# Patient Record
Sex: Male | Born: 1995 | Race: White | Hispanic: No | Marital: Single | Smoking: Never smoker
Health system: Southern US, Community
[De-identification: ages and names within clinical notes are randomized; demographics above are authoritative.]

## PROBLEM LIST (undated history)

## (undated) DIAGNOSIS — G43909 Migraine, unspecified, not intractable, without status migrainosus: Secondary | ICD-10-CM

---
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
## 2015-01-16 ENCOUNTER — Ambulatory Visit
Admission: EM | Admit: 2015-01-16 | Discharge: 2015-01-16 | Disposition: A | Discharge status: Home / Self Care | Payer: BLUE CROSS/BLUE SHIELD | Attending: Family Medicine | Admitting: Family Medicine

## 2015-01-16 ENCOUNTER — Encounter: Payer: Self-pay | Admitting: Emergency Medicine

## 2015-01-16 DIAGNOSIS — H6692 Otitis media, unspecified, left ear: Secondary | ICD-10-CM | POA: Diagnosis not present

## 2015-01-16 DIAGNOSIS — J069 Acute upper respiratory infection, unspecified: Secondary | ICD-10-CM

## 2015-01-16 MED ORDER — AMOXICILLIN 875 MG PO TABS: 875.0000 mg | ORAL_TABLET | Freq: Two times a day (BID) | ORAL | Status: AC

## 2015-01-16 NOTE — ED Provider Notes (Signed)
[facility]  ____________________________________________  Time seen: Approximately 1948 PM  I have reviewed the triage vital signs and the nursing notes.   HISTORY  Chief Complaint Otalgia   HPI Jonathan Hammond is a [age] y.o. male presents for complaints of left ear pain x 2 days. States pain is 4/10 aching pain. Denies aggravating factors. Denies pain radiation. Denies fall or trauma. Denies head injury. Denies foreign bodies. Denies hearing changes. Denies ear discharge or drainage.   Reports over last 1-1.5 weeks has had some runny nose, nasal congestion and intermittent cough and reports these symptoms are improving. Denies fevers, dizziness, headache, neck pain, back pain, chest pain, shortness of breath, abdominal pain, trauma, rash or other complaints. States not taking over the counter medications for same.    Past Medical History  Diagnosis Date  . Migraines     There are no active problems to display for this patient.   History reviewed. No pertinent past surgical history.  Current Outpatient Rx  Name  Route  Sig  Dispense  Refill  . nortriptyline (PAMELOR) 10 MG capsule   Oral   Take 10 mg by mouth at bedtime.         .           .           .             Allergies Review of patient's allergies indicates no known allergies.  History reviewed. No pertinent family history.  Social History Social History  Substance Use Topics  . Smoking status: Never Smoker   . Smokeless tobacco: Never Used  . Alcohol Use: No    Review of Systems Constitutional: No fever/chills Eyes: No visual changes. ENT: No sore throat. Left ear pain. Positive runny nose, congestion and intermittent cough.  Cardiovascular: Denies chest pain. Respiratory: Denies shortness of breath. Gastrointestinal: No abdominal pain.  No nausea, no vomiting.  No diarrhea.  No constipation. Genitourinary: Negative for dysuria. Musculoskeletal: Negative for back pain. Skin: Negative  for rash. Neurological: Negative for headaches, focal weakness or numbness.  10-point ROS otherwise negative.   ____________________________________________   PHYSICAL EXAM:  VITAL SIGNS: ED Triage Vitals  Enc Vitals Group     BP 01/16/15 1859 130/76 mmHg     Pulse Rate 01/16/15 1859 82     Resp 01/16/15 1859 16     Temp 01/16/15 1859 97 F (36.1 C)     Temp Source 01/16/15 1859 Tympanic     SpO2 01/16/15 1859 98 %     Weight 01/16/15 1859 157 lb (71.215 kg)     Height 01/16/15 1859 5\' 10"  (1.778 m)     Head Cir --      Peak Flow --      Pain Score 01/16/15 1902 3     Pain Loc --      Pain Edu? --      Excl. in GC? --     Constitutional: Alert and oriented. Well appearing and in no acute distress. Eyes: Conjunctivae are normal. PERRL. EOMI. Head: Atraumatic. No sinus TTP, no swelling, no erythema. No TMJ tenderness.   Ears: Right: no erythema, normal TMs, no exudate or drainage, nontender, TM intact. Left: mod erythema, with mild bulging TM, no exudate or drainage, TM  appears intact, nontender to palpation. No surrounding erythema, swelling, or tenderness. Hearing grossly intact bilaterally.   Nose: mild nasal congestion with clear rhinorrhea.   Mouth/Throat: Mucous membranes are  moist.  Oropharynx non-erythematous.No tonsillar swelling or exudate.  Neck: No stridor.  No cervical spine tenderness to palpation. Hematological/Lymphatic/Immunilogical: No cervical lymphadenopathy. Cardiovascular: Normal rate, regular rhythm. Grossly normal heart sounds.  Good peripheral circulation. Respiratory: Normal respiratory effort.  No retractions. Lungs CTAB. Gastrointestinal: Soft and nontender. No distention. Normal Bowel sounds.   Musculoskeletal: No lower or upper extremity tenderness nor edema.  No cervical, thoracic or lumbar TTP.  Neurologic:  Normal speech and language. No gross focal neurologic deficits are appreciated. No gait instability. Skin:  Skin is warm, dry and intact.  No rash noted. Psychiatric: Mood and affect are normal. Speech and behavior are normal.  ____________________________________________   LABS (all labs ordered are listed, but only abnormal results are displayed)  Labs Reviewed - No data to display ____________________________________________   INITIAL IMPRESSION / ASSESSMENT AND PLAN / ED COURSE  Pertinent labs & imaging results that were available during my care of the patient were reviewed by me and considered in my medical decision making (see chart for details).  Very well appearing. No acute distress. Mother at bedside. Presents for complaints of left ear pain x 2 days. Reports recent upper respiratory infection symptoms x 1-1.5 weeks prior to onset of ear pain. Lungs clear throughout. Left otitis media. Will treat otitis media with oral amoxicllin and supportive treatments for upper respiratory infection which suspect viral, with rest, fluids, prn OTC claritin-D (reports will get over the counter and not need rx), prn otc ibuprofen or tylenol and PCP follow up. Information for Dr Genevive Bi  ENT also given as needed.    Discussed follow up with Primary care physician this week. Discussed follow up and return parameters including no resolution or any worsening concerns. Patient verbalized understanding and agreed to plan.   ____________________________________________   FINAL CLINICAL IMPRESSION(S) / ED DIAGNOSES  Final diagnoses:  Acute left otitis media, recurrence not specified, unspecified otitis media type  Upper respiratory infection       Renford Dills, NP 01/18/15 1435

## 2015-01-16 NOTE — ED Notes (Signed)
Patient c/o pain in his left ear since Saturday.  Patient denies fevers.

## 2015-01-16 NOTE — Discharge Instructions (Signed)
Take medication as prescribed. Take over the counter tylenol or ibuprofen as needed. Rest. Take over the counter Claritin-D as discussed.   Follow up with your primary care physician or Ear, nose and throat above as needed. Return to Urgent care for new or worsening concerns.   Otitis Media, Adult Otitis media is redness, soreness, and inflammation of the middle ear. Otitis media may be caused by allergies or, most commonly, by infection. Often it occurs as a complication of the common cold. SIGNS AND SYMPTOMS Symptoms of otitis media may include:  Earache.  Fever.  Ringing in your ear.  Headache.  Leakage of fluid from the ear. DIAGNOSIS To diagnose otitis media, your health care provider will examine your ear with an otoscope. This is an instrument that allows your health care provider to see into your ear in order to examine your eardrum. Your health care provider also will ask you questions about your symptoms. TREATMENT  Typically, otitis media resolves on its own within 3-5 days. Your health care provider may prescribe medicine to ease your symptoms of pain. If otitis media does not resolve within 5 days or is recurrent, your health care provider may prescribe antibiotic medicines if he or she suspects that a bacterial infection is the cause. HOME CARE INSTRUCTIONS   If you were prescribed an antibiotic medicine, finish it all even if you start to feel better.  Take medicines only as directed by your health care provider.  Keep all follow-up visits as directed by your health care provider. SEEK MEDICAL CARE IF:  You have otitis media only in one ear, or bleeding from your nose, or both.  You notice a lump on your neck.  You are not getting better in 3-5 days.  You feel worse instead of better. SEEK IMMEDIATE MEDICAL CARE IF:   You have pain that is not controlled with medicine.  You have swelling, redness, or pain around your ear or stiffness in your neck.  You  notice that part of your face is paralyzed.  You notice that the bone behind your ear (mastoid) is tender when you touch it. MAKE SURE YOU:   Understand these instructions.  Will watch your condition.  Will get help right away if you are not doing well or get worse.   This information is not intended to replace advice given to you by your health care provider. Make sure you discuss any questions you have with your health care provider.   Document Released: 10/27/2003 Document Revised: 02/11/2014 Document Reviewed: 08/18/2012 Elsevier Interactive Patient Education 2016 [employer]  Upper Respiratory Infection, Adult Most upper respiratory infections (URIs) are a viral infection of the air passages leading to the lungs. A URI affects the nose, throat, and upper air passages. The most common type of URI is nasopharyngitis and is typically referred to as "the common cold." URIs run their course and usually go away on their own. Most of the time, a URI does not require medical attention, but sometimes a bacterial infection in the upper airways can follow a viral infection. This is called a secondary infection. Sinus and middle ear infections are common types of secondary upper respiratory infections. Bacterial pneumonia can also complicate a URI. A URI can worsen asthma and chronic obstructive pulmonary disease (COPD). Sometimes, these complications can require emergency medical care and may be life threatening.  CAUSES Almost all URIs are caused by viruses. A virus is a type of germ and can spread from one person to  another.  RISKS FACTORS You may be at risk for a URI if:   You smoke.   You have chronic heart or lung disease.  You have a weakened defense (immune) system.   You are very young or very old.   You have nasal allergies or asthma.  You work in crowded or poorly ventilated areas.  You work in health care facilities or schools. SIGNS AND SYMPTOMS  Symptoms typically  develop 2-3 days after you come in contact with a cold virus. Most viral URIs last 7-10 days. However, viral URIs from the influenza virus (flu virus) can last 14-18 days and are typically more severe. Symptoms may include:   Runny or stuffy (congested) nose.   Sneezing.   Cough.   Sore throat.   Headache.   Fatigue.   Fever.   Loss of appetite.   Pain in your forehead, behind your eyes, and over your cheekbones (sinus pain).  Muscle aches.  DIAGNOSIS  Your health care provider may diagnose a URI by:  Physical exam.  Tests to check that your symptoms are not due to another condition such as:  Strep throat.  Sinusitis.  Pneumonia.  Asthma. TREATMENT  A URI goes away on its own with time. It cannot be cured with medicines, but medicines may be prescribed or recommended to relieve symptoms. Medicines may help:  Reduce your fever.  Reduce your cough.  Relieve nasal congestion. HOME CARE INSTRUCTIONS   Take medicines only as directed by your health care provider.   Gargle warm saltwater or take cough drops to comfort your throat as directed by your health care provider.  Use a warm mist humidifier or inhale steam from a shower to increase air moisture. This may make it easier to breathe.  Drink enough fluid to keep your urine clear or pale yellow.   Eat soups and other clear broths and maintain good nutrition.   Rest as needed.   Return to work when your temperature has returned to normal or as your health care provider advises. You may need to stay home longer to avoid infecting others. You can also use a face mask and careful hand washing to prevent spread of the virus.  Increase the usage of your inhaler if you have asthma.   Do not use any tobacco products, including cigarettes, chewing tobacco, or electronic cigarettes. If you need help quitting, ask your health care provider. PREVENTION  The best way to protect yourself from getting a cold  is to practice good hygiene.   Avoid oral or hand contact with people with cold symptoms.   Wash your hands often if contact occurs.  There is no clear evidence that vitamin C, vitamin E, echinacea, or exercise reduces the chance of developing a cold. However, it is always recommended to get plenty of rest, exercise, and practice good nutrition.  SEEK MEDICAL CARE IF:   You are getting worse rather than better.   Your symptoms are not controlled by medicine.   You have chills.  You have worsening shortness of breath.  You have brown or red mucus.  You have yellow or brown nasal discharge.  You have pain in your face, especially when you bend forward.  You have a fever.  You have swollen neck glands.  You have pain while swallowing.  You have white areas in the back of your throat. SEEK IMMEDIATE MEDICAL CARE IF:   You have severe or persistent:  Headache.  Ear pain.  Sinus pain.  Chest pain.  You have chronic lung disease and any of the following:  Wheezing.  Prolonged cough.  Coughing up blood.  A change in your usual mucus.  You have a stiff neck.  You have changes in your:  Vision.  Hearing.  Thinking.  Mood. MAKE SURE YOU:   Understand these instructions.  Will watch your condition.  Will get help right away if you are not doing well or get worse.   This information is not intended to replace advice given to you by your health care provider. Make sure you discuss any questions you have with your health care provider.   Document Released: 07/17/2000 Document Revised: 06/07/2014 Document Reviewed: 04/28/2013 Elsevier Interactive Patient Education [employer].
